# Patient Record
Sex: Male | Born: 1965 | Race: White | Hispanic: No | Marital: Married | Smoking: Never smoker
Health system: Southern US, Community
[De-identification: ages and names within clinical notes are randomized; demographics above are authoritative.]

## PROBLEM LIST (undated history)

## (undated) DIAGNOSIS — E7800 Pure hypercholesterolemia, unspecified: Secondary | ICD-10-CM

## (undated) DIAGNOSIS — I10 Essential (primary) hypertension: Secondary | ICD-10-CM

## (undated) HISTORY — DX: Essential (primary) hypertension: I10

## (undated) HISTORY — PX: OTHER SURGICAL HISTORY: SHX169

## (undated) HISTORY — DX: Pure hypercholesterolemia, unspecified: E78.00

---

## 2014-09-03 ENCOUNTER — Encounter: Payer: Self-pay | Admitting: Internal Medicine

## 2014-09-03 ENCOUNTER — Ambulatory Visit (INDEPENDENT_AMBULATORY_CARE_PROVIDER_SITE_OTHER): Payer: BLUE CROSS/BLUE SHIELD | Admitting: Internal Medicine

## 2014-09-03 ENCOUNTER — Encounter (INDEPENDENT_AMBULATORY_CARE_PROVIDER_SITE_OTHER): Payer: Self-pay

## 2014-09-03 ENCOUNTER — Encounter: Payer: Self-pay | Admitting: *Deleted

## 2014-09-03 VITALS — BP 140/82 | HR 88 | Temp 99.1°F | Ht 70.0 in | Wt 236.6 lb

## 2014-09-03 DIAGNOSIS — I7121 Aneurysm of the ascending aorta, without rupture: Secondary | ICD-10-CM | POA: Insufficient documentation

## 2014-09-03 DIAGNOSIS — R05 Cough: Secondary | ICD-10-CM

## 2014-09-03 DIAGNOSIS — R058 Other specified cough: Secondary | ICD-10-CM | POA: Insufficient documentation

## 2014-09-03 DIAGNOSIS — I712 Thoracic aortic aneurysm, without rupture: Secondary | ICD-10-CM

## 2014-09-03 DIAGNOSIS — I10 Essential (primary) hypertension: Secondary | ICD-10-CM

## 2014-09-03 MED ORDER — FAMOTIDINE 20 MG PO TABS: ORAL_TABLET | ORAL | Status: DC

## 2014-09-03 MED ORDER — PANTOPRAZOLE SODIUM 40 MG PO TBEC: 40.0000 mg | DELAYED_RELEASE_TABLET | Freq: Every day | ORAL | Status: DC

## 2014-09-03 MED ORDER — VALSARTAN 160 MG PO TABS: 160.0000 mg | ORAL_TABLET | Freq: Every day | ORAL | Status: DC

## 2014-09-03 MED ORDER — TRAMADOL HCL 50 MG PO TABS: ORAL_TABLET | ORAL | Status: DC

## 2014-09-03 NOTE — Assessment & Plan Note (Signed)
In summary he has new onset refractory cough/ wheezing refractory to really aggressive asthma rx including sama/saba/steroids so this is a very severe cough syndrome   The most common causes of chronic cough in immunocompetent adults include the following: upper airway cough syndrome (UACS), previously referred to as postnasal drip syndrome (PNDS), which is caused by variety of rhinosinus conditions; (2) asthma; (3) GERD; (4) chronic bronchitis from cigarette smoking or other inhaled environmental irritants; (5) nonasthmatic eosinophilic bronchitis; and (6) bronchiectasis.   These conditions, singly or in combination, have accounted for up to 94% of the causes of chronic cough in prospective studies.   Other conditions have constituted no >6% of the causes in prospective studies These have included bronchogenic carcinoma, chronic interstitial pneumonia, sarcoidosis, left ventricular failure, ACEI-induced cough, and aspiration from a condition associated with pharyngeal dysfunction.    Chronic cough is often simultaneously caused by more than one condition. A single cause has been found from 38 to 82% of the time, multiple causes from 18 to 62%. Multiply caused cough has been the result of three diseases up to 42% of the time.       Based on hx and exam, this is almost certainly  Upper airway cough syndrome, so named because it's frequently impossible to sort out how much is  CR/sinusitis with freq throat clearing (which can be related to primary GERD)   vs  causing  secondary (" extra esophageal")  GERD from wide swings in gastric pressure that occur with throat clearing, often  promoting self use of mint and menthol lozenges that reduce the lower esophageal sphincter tone and exacerbate the problem further in a cyclical fashion.   These are the same pts (now being labeled as having "irritable larynx syndrome" by some cough centers) who not infrequently have a history of having failed to tolerate ace  inhibitors,  dry powder inhalers or biphosphonates or report having atypical reflux symptoms that don't respond to standard doses of PPI , and are easily confused as having aecopd or asthma flares by even experienced allergists/ pulmonologists.   The first step is to maximize acid suppression and eliminate acei and cyclical coughing then regroup if the cough persists.  See instructions for specific recommendations which were reviewed directly with the patient who was given a copy with highlighter outlining the key components.

## 2014-09-03 NOTE — Progress Notes (Signed)
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 but he has no memory of it then abruptly ill with a head cold one week befor tgiving 2015 [facility] and rx ceftin/ depomedrol / codeine but never completely resolved then one week before xmas seen by company doc rx oral steroids/ inhaler/ more codeine but continued with noct cough to the point where couldn't lie flat assoc with am wheeze per wife much worse p levaquin > [place] [facility] 08/28/14 with sob > [facility] 09/03/2014    09/03/2014 1st  Pulmonary office visit/ Jamaira Sherk / on acei for hbp Chief Complaint  Patient presents with  . Pulmonary Consult    [facility],had steroid pak,abx,inhaler.Had coughing problems since Thanksgiving got worse when went to ER.Not sleeping well, sob with exertion,cough-occass. prod.-dk. yellow and clear sticky,wheezing,denies cp,midchest tightness,fever only in Nov.,PND  Cough started abruptly late nov 2015 persisted  daily since despite multiple abx rx > typcially  starts  after 3pm gradually worse as evening goes on  To point of gag and vomit - sob even when not coughing. Some better with saba/ sama  No obvious day to day or daytime variabilty or assoc chronic cough or cp or chest tightness, subjective wheeze overt sinus or hb symptoms. No unusual exp hx or h/o childhood pna/ asthma or knowledge of premature birth.   Also denies any obvious fluctuation of symptoms with weather or environmental changes or other aggravating or alleviating factors except as outlined above   Current Medications, Allergies, Complete Past Medical History, Past Surgical History, Family History, and Social History were reviewed in [facility] electronic medical record.  ROS  The following are not active complaints unless bolded sore throat,  dysphagia, dental problems, itching, sneezing,  nasal congestion or excess/ purulent secretions, ear ache,   fever, chills, sweats, unintended wt loss, pleuritic or exertional cp, hemoptysis,  orthopnea pnd or leg swelling, presyncope, palpitations, heartburn, abdominal pain, anorexia, nausea, vomiting, diarrhea  or change in bowel or urinary habits, change in stools or urine, dysuria,hematuria,  rash, arthralgias, visual complaints, headache, numbness weakness or ataxia or problems with walking or coordination,  change in mood/affect or memory.           Review of Systems     Objective:   Physical Exam   amb obese wm with classic pseudowheeze  Wt Readings from Last 3 Encounters:  09/03/14 107.321 kg (236 lb 9.6 oz)    Vital signs reviewed   HEENT: nl dentition, turbinates, and orophanx. Nl external ear canals without cough reflex   NECK :  without JVD/Nodes/TM/ nl carotid upstrokes bilaterally   LUNGS: no acc muscle use, clear to A and P bilaterally without cough on insp or exp maneuvers - transmitted upper airway wheeze only    CV:  RRR  no s3 or murmur or increase in P2, no edema   ABD:  soft and nontender with nl excursion in the supine position. 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

## 2014-09-03 NOTE — Assessment & Plan Note (Signed)
Defer f/u to Dr Sherral Hammersobbins ? Needs beta blocker > Strongly prefer in this setting: Bystolic, the most beta -1  selective Beta blocker available in sample form, with bisoprolol the most selective generic choice  on the market.

## 2014-09-03 NOTE — Patient Instructions (Addendum)
Stop lisinopril and start diovan 160 mg one daily  Pantoprazole (protonix) 40 mg   Take 30-60 min before first meal of the day and Pepcid 20 mg one bedtime until return to office - this is the best way to tell whether stomach acid is contributing to your problem.  GERD (REFLUX)  is an extremely common cause of respiratory symptoms just like yours , many times with no obvious heartburn at all.    It can be treated with medication, but also with lifestyle changes including avoidance of late meals, excessive alcohol, smoking cessation, and avoid fatty foods, chocolate, peppermint, colas, red wine, and acidic juices such as orange juice.  NO MINT OR MENTHOL PRODUCTS SO NO COUGH DROPS  USE SUGARLESS CANDY INSTEAD (Jolley ranchers or Stover's or Life Savers) or even ice chips will also do - the key is to swallow to prevent all throat clearing. NO OIL BASED VITAMINS - use powdered substitutes.   Finish prednisone up and only inhalers when you need but you should see the need go way down over the next week or so    Take delsym two tsp every 12 hours and supplement if needed with  tramadol 50 mg up to 2 every 4 hours to suppress the urge to cough. Swallowing water or using ice chips/non mint and menthol containing candies (such as lifesavers or sugarless jolly ranchers) are also effective.  You should rest your voice and avoid activities that you know make you cough.  Once you have eliminated the cough for 3 straight days try reducing the tramadol first,  then the delsym as tolerated.     If you are satisfied with your treatment plan,  let your doctor know and he/she can either refill your medications or you can return here when your prescription runs out.     If in any way you are not 100% satisfied,  please tell us.  If 100% better, tell your friends!  Pulmonary follow up is as needed

## 2014-09-03 NOTE — Assessment & Plan Note (Signed)
ACE inhibitors are problematic in  pts with airway complaints because  even experienced pulmonologists can't always distinguish ace effects from copd/asthma/pnds/ allergies etc.  By themselves they don't actually cause a problem, much like oxygen can't by itself start a fire, but they certainly serve as a powerful catalyst or enhancer for any "fire"  or inflammatory process in the upper airway, be it caused by an ET  tube or more commonly reflux (especially in the obese or pts with known GERD or who are on biphoshonates) or URI's, due to interference with bradykinin clearance.  The effects of acei on bradykinin levels occurs in 100% of pt's on acei (unless they surreptitiously stop the med!) but the classic cough is only reported in 5%.  This leaves 95% of pts on acei's  with a variety of syndromes including no identifiable symptom in most  vs non-specific symptoms that wax and wane depending on what other insult is occuring at the level of the upper airway.   Try diovan 160 x 4 weeks and f/u with primary care if better, if not return here for further w/u

## 2014-09-04 ENCOUNTER — Telehealth: Payer: Self-pay | Admitting: Internal Medicine

## 2014-09-04 NOTE — Telephone Encounter (Signed)
Called [phone] PT ID #  [id number]  Waiting for fax to be sent to triage fax #.

## 2014-09-04 NOTE — Telephone Encounter (Signed)
Received the fax from insurance.  These have been filled out and placed in MW look at to be signed.  Pt is aware and that we will contact him once we have received the approval or denial.  Will forward to leslie to follow up on.

## 2014-09-07 NOTE — Telephone Encounter (Signed)
Pt aware, nothing further needed.  ?

## 2014-09-07 NOTE — Telephone Encounter (Signed)
PA form signed and faxed back  Will await approval/denial

## 2014-09-07 NOTE — Telephone Encounter (Signed)
Dr Sherene SiresWert, do you want him to take 20 mg prilosec otc while we wait on PA? Please advise thanks!

## 2014-09-07 NOTE — Telephone Encounter (Signed)
Yes that's fine 

## 2014-09-07 NOTE — Telephone Encounter (Signed)
Pt needs to know if he should be taking Prilosec while we are waiting to hear from insurance

## 2014-09-09 ENCOUNTER — Institutional Professional Consult (permissible substitution): Payer: Self-pay | Admitting: Internal Medicine

## 2014-09-10 NOTE — Telephone Encounter (Signed)
Received letter of approval from ins  Pantoprazole approved from 09/08/14 until 09/08/17  Pt aware and pharmacy notified

## 2014-09-16 ENCOUNTER — Ambulatory Visit (INDEPENDENT_AMBULATORY_CARE_PROVIDER_SITE_OTHER): Payer: BLUE CROSS/BLUE SHIELD | Admitting: Internal Medicine

## 2014-09-16 ENCOUNTER — Encounter: Payer: Self-pay | Admitting: Internal Medicine

## 2014-09-16 ENCOUNTER — Ambulatory Visit (INDEPENDENT_AMBULATORY_CARE_PROVIDER_SITE_OTHER)
Admission: RE | Admit: 2014-09-16 | Discharge: 2014-09-16 | Disposition: A | Discharge status: Home / Self Care | Payer: BLUE CROSS/BLUE SHIELD | Source: Ambulatory Visit | Attending: Internal Medicine | Admitting: Internal Medicine

## 2014-09-16 ENCOUNTER — Institutional Professional Consult (permissible substitution): Payer: BLUE CROSS/BLUE SHIELD | Admitting: Internal Medicine

## 2014-09-16 VITALS — BP 128/80 | HR 94 | Temp 99.0°F | Ht 70.0 in | Wt 228.0 lb

## 2014-09-16 DIAGNOSIS — R05 Cough: Secondary | ICD-10-CM

## 2014-09-16 DIAGNOSIS — I712 Thoracic aortic aneurysm, without rupture: Secondary | ICD-10-CM

## 2014-09-16 DIAGNOSIS — R058 Other specified cough: Secondary | ICD-10-CM

## 2014-09-16 DIAGNOSIS — I7121 Aneurysm of the ascending aorta, without rupture: Secondary | ICD-10-CM

## 2014-09-16 DIAGNOSIS — I10 Essential (primary) hypertension: Secondary | ICD-10-CM

## 2014-09-16 DIAGNOSIS — J45901 Unspecified asthma with (acute) exacerbation: Secondary | ICD-10-CM

## 2014-09-16 MED ORDER — PREDNISONE 10 MG PO TABS: ORAL_TABLET | ORAL | Status: DC

## 2014-09-16 MED ORDER — AMOXICILLIN-POT CLAVULANATE 875-125 MG PO TABS: 1.0000 | ORAL_TABLET | Freq: Two times a day (BID) | ORAL | Status: DC

## 2014-09-16 MED ORDER — TRAMADOL HCL 50 MG PO TABS: ORAL_TABLET | ORAL | Status: AC

## 2014-09-16 MED ORDER — MOMETASONE FURO-FORMOTEROL FUM 100-5 MCG/ACT IN AERO: INHALATION_SPRAY | RESPIRATORY_TRACT | Status: DC

## 2014-09-16 NOTE — Progress Notes (Signed)
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     History of Present Illness  09/03/2014 1st [place] [facility] office visit/ Verla Bryngelson / on acei for hbp Chief Complaint  Patient presents with  . Pulmonary Consult    [facility],2016,had steroid pak,abx,inhaler.Had coughing problems since Thanksgiving got worse when went to ER.Not sleeping well, sob with exertion,cough-occass. prod.-dk. yellow and clear sticky,wheezing,denies cp,midchest tightness,fever only in Nov.,PND  Cough started abruptly late nov 2015 persisted  daily since then despite multiple abx rx > typcially  starts  after 3pm gradually worse as evening goes on  To point of gag and vomit - sob even when not coughing. Some better with saba/ sama rec Stop lisinopril and start diovan 160 mg one daily Pantoprazole (protonix) 40 mg   Take 30-60 min before first meal of the day and Pepcid 20 mg one bedtime until return to office  GERD diet  Finish prednisone up and only use inhalers when you need but you should see the need go way down over the next week or so   Take delsym two tsp every 12 hours and supplement if needed with  tramadol 50 mg up to 2 every 4 hours   Once you have eliminated the cough for 3 straight days try reducing the tramadol first,  then the delsym as tolerated.     09/16/2014  Acute ov/Jyla Hopf re:  persistent cough off acei since last ov  Chief Complaint  Patient presents with  . Acute Visit    Pt c/o increased SOB and cough for the past 2 days. He woke up feeling SOB and has been using atrovent and albuterol inhalers every 3 hours. He has a prod cough with brown to yellow "stringy" mucus.   able to go s any inhalers until 24 h prior to OV   And stopped tramadol x 3 days prior to onset of symptoms  Last inhalers x 3 h prior to OV    No obvious day to day or daytime variabilty or assoc  cp or chest tightness, subjective wheeze overt sinus or hb symptoms. No unusual exp hx or h/o childhood pna/ asthma or knowledge of premature birth.   Also denies any obvious fluctuation of symptoms with weather or environmental changes or other aggravating or alleviating factors except as outlined above   Current Medications, Allergies, Complete Past Medical History, Past Surgical History, Family History, and Social History were reviewed in [facility] electronic medical record.  ROS  The following are not active complaints unless bolded sore throat, dysphagia, dental problems, itching, sneezing,  nasal congestion or excess/ purulent secretions, ear ache,   fever, chills, sweats, unintended wt loss, pleuritic or exertional cp, hemoptysis,  orthopnea pnd or leg swelling, presyncope, palpitations, heartburn, abdominal pain, anorexia, nausea, vomiting, diarrhea  or change in bowel or urinary habits, change in stools or urine, dysuria,hematuria,  rash, arthralgias, visual complaints, headache, numbness weakness or ataxia or problems with walking or coordination,  change in mood/affect or memory.               Objective:   Physical Exam   amb obese wm with extremely hoarse voice/ prominent pseudowheeze  Wt Readings from Last 3 Encounters:  09/16/14 228 lb (103.42 kg)  09/03/14 236 lb 9.6 oz (107.321 kg)    Vital signs reviewed  HEENT: nl dentition, turbinates, and orophanx. Nl external ear  canals without cough reflex   NECK :  without JVD/Nodes/TM/ nl carotid upstrokes bilaterally   LUNGS: no acc muscle use, insp and exp wheezes, difficult to distinguish from upper airway    CV:  RRR  no s3 or murmur or increase in P2, no edema   ABD:  soft and nontender with nl excursion in the supine position. 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

## 2014-09-16 NOTE — Patient Instructions (Addendum)
You will need follow up on your thoracic aorta but I will defer that Dr Sherral Hammersobbins and tight control of your blood pressure is the key   Start dulera 100 Take 2 puffs first thing in am and then another 2 puffs about 12 hours later.   Only use your albuterol as a rescue medication to be used if you can't catch your breath by resting or doing a relaxed purse lip breathing pattern.  - The less you use it, the better it will work when you need it. - Ok to use up to 2 puffs  every 4 hours if you must but call for immediate appointment if use goes up over your usual need - Don't leave home without it !!  (think of it like the spare tire for your car)   The key to effective treatment for your cough is eliminating the non-stop cycle of cough you're stuck in long enough to let your airway heal completely and then see if there is anything still making you cough once you stop the cough suppression, but this should take no more than 5 days to figure out  First take delsym two tsp every 12 hours and supplement if needed with  tramadol 50 mg up to 2 every 4 hours to suppress the urge to cough at all or even clear your throat. Swallowing water or using ice chips/non mint and menthol containing candies (such as lifesavers or sugarless jolly ranchers) are also effective.  You should rest your voice and avoid activities that you know make you cough.  Once you have eliminated the cough for 3 straight days try reducing the tramadol first,  then the delsym as tolerated.    Prednisone 10 mg take  4 each am x 2 days,   2 each am x 2 days,  1 each am x 2 days and stop (this is to eliminate allergies and inflammation from coughing)  Continue Protonix (pantoprazole) Take 30-60 min before first meal of the day and Pepcid 20 mg one bedtime plus chlorpheniramine 4 mg x 2 at bedtime (both available over the counter)  until cough is completely gone for at least a week without the need for cough suppression    Augmentin 875 mg  take one pill twice daily  X 10 days - take at breakfast and supper with large glass of water.  It would help reduce the usual side effects (diarrhea and yeast infections) if you ate cultured yogurt at lunch.   If not better next step is sinus CT if not better - call [facility] at [phone] to schedule prior to your follow up here   Please remember to go to the xray  department downstairs for your tests - we will call you with the results when they are available.  Please schedule a follow up office visit in 2 weeks, sooner if needed        .

## 2014-09-17 ENCOUNTER — Encounter: Payer: Self-pay | Admitting: Internal Medicine

## 2014-09-17 DIAGNOSIS — J45901 Unspecified asthma with (acute) exacerbation: Secondary | ICD-10-CM | POA: Insufficient documentation

## 2014-09-17 NOTE — Assessment & Plan Note (Signed)
Spirometry 09/16/14  fevq 1.27 (31%) ratio 50 with obstructed f/v  - 09/17/2014 p extensive coaching HFA effectiveness =    90% so try dulera 100 2bid   Not clear why he would suddenly develop true asthma in setting of upper airway cough syndrome if he never had tendency to asthma previously but clearly needs a controlling medication > dulera 100 should be well tolerated s causing cough

## 2014-09-17 NOTE — Assessment & Plan Note (Signed)
Too soon to exclude acei effects from the "usual list of suspects" but clearly not free of the cough yet with airway symptoms that are difficult to control and sort out.  DDX of  difficult airways management all start with A and  include Adherence, Ace Inhibitors, Acid Reflux, Active Sinus Disease, Alpha 1 Antitripsin deficiency, Anxiety masquerading as Airways dz,  ABPA,  allergy(esp in young), Aspiration (esp in elderly), Adverse effects of DPI,  Active smokers, plus two Bs  = Bronchiectasis and Beta blocker use..and one C= CHF   Adherence is always the initial "prime suspect" and is a multilayered concern that requires a "trust but verify" approach in every patient - starting with knowing how to use medications, especially inhalers, correctly, keeping up with refills and understanding the fundamental difference between maintenance and prns vs those medications only taken for a very short course and then stopped and not refilled.  The proper method of use, as well as anticipated side effects, of a metered-dose inhaler are discussed and demonstrated to the patient. Improved effectiveness after extensive coaching during this visit to a level of approximately  90% so try dulera 100 2bid in case this really is asthma related cough  ? Acid (or non-acid) GERD > always difficult to exclude as up to 75% of pts in some series report no assoc GI/ Heartburn symptoms> rec max (24h)  acid suppression and diet restrictions/ reviewed and instructions given in writing.   ? Active sinus dz > augmentin x 10 days then sinus ct if not better  ? acei > leave off indefinitely given the ambiguity inherent in evaluation of upper airway coughing   See instructions for specific recommendations which were reviewed directly with the patient who was given a copy with highlighter outlining the key components.

## 2014-09-17 NOTE — Assessment & Plan Note (Signed)
Trial off acei 09/03/2014 due to cough   Adequate control on present rx, reviewed > no change in rx needed  > further rx per primary care

## 2014-09-17 NOTE — Assessment & Plan Note (Signed)
See CT [place] 08/28/14 x 4.3 cm   Probably needs to be on BB > Strongly prefer in this setting: Bystolic, the most beta -1  selective Beta blocker available in sample form, with bisoprolol the most selective generic choice  on the market >defer to primary care

## 2014-09-18 ENCOUNTER — Telehealth: Payer: Self-pay | Admitting: Internal Medicine

## 2014-09-18 NOTE — Telephone Encounter (Signed)
Best to swallow mucus rather than clear his throat and aggravate the cough.   Best option is mucinex dm 1200 mg every 12 hours and only take the tramadol if can't control the cough with mucinex dm  cxr was perfectly clear which is not a surprise because his problem is in the airways not out in the periphery of the lung

## 2014-09-18 NOTE — Telephone Encounter (Signed)
Patient wants results of his CXR.  Patient says that Dr. Sherene SiresWert told him not to cough up anything, to suppress his cough.  Pt says that he has a lot of mucus in his throat and having trouble suppressing the cough.  MW please advise.

## 2014-09-18 NOTE — Telephone Encounter (Signed)
Patient notified and advised to call us back if he does not improve or worsens.  Nothing further needed.

## 2014-09-22 ENCOUNTER — Telehealth: Payer: Self-pay | Admitting: Internal Medicine

## 2014-09-22 DIAGNOSIS — R05 Cough: Secondary | ICD-10-CM

## 2014-09-22 DIAGNOSIS — R058 Other specified cough: Secondary | ICD-10-CM

## 2014-09-22 NOTE — Telephone Encounter (Signed)
Spoke with the pt  He states that his cough has improved some, but still coughing some and bothered by PND  Pt wants to set up CT sinus prior to next ov 09/30/14  [facility]  Nothing further needed

## 2014-09-30 ENCOUNTER — Ambulatory Visit (INDEPENDENT_AMBULATORY_CARE_PROVIDER_SITE_OTHER)
Admission: RE | Admit: 2014-09-30 | Discharge: 2014-09-30 | Disposition: A | Discharge status: Home / Self Care | Payer: BLUE CROSS/BLUE SHIELD | Source: Ambulatory Visit | Attending: Internal Medicine | Admitting: Internal Medicine

## 2014-09-30 ENCOUNTER — Encounter: Payer: Self-pay | Admitting: Internal Medicine

## 2014-09-30 ENCOUNTER — Ambulatory Visit (INDEPENDENT_AMBULATORY_CARE_PROVIDER_SITE_OTHER): Payer: BLUE CROSS/BLUE SHIELD | Admitting: Internal Medicine

## 2014-09-30 VITALS — BP 130/88 | HR 81 | Ht 70.0 in | Wt 234.0 lb

## 2014-09-30 DIAGNOSIS — R05 Cough: Secondary | ICD-10-CM

## 2014-09-30 DIAGNOSIS — I7121 Aneurysm of the ascending aorta, without rupture: Secondary | ICD-10-CM

## 2014-09-30 DIAGNOSIS — R058 Other specified cough: Secondary | ICD-10-CM

## 2014-09-30 DIAGNOSIS — I712 Thoracic aortic aneurysm, without rupture: Secondary | ICD-10-CM

## 2014-09-30 DIAGNOSIS — J45901 Unspecified asthma with (acute) exacerbation: Secondary | ICD-10-CM

## 2014-09-30 MED ORDER — MOMETASONE FURO-FORMOTEROL FUM 100-5 MCG/ACT IN AERO: INHALATION_SPRAY | RESPIRATORY_TRACT | Status: DC

## 2014-09-30 NOTE — Progress Notes (Signed)
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     History of Present Illness  09/03/2014 1st  Pulmonary office visit/ Wert / on acei for hbp Chief Complaint  Patient presents with  . Pulmonary Consult    bronchitis per ER MD @ [facility] Jan. 8,2016,had steroid pak,abx,inhaler.Had coughing problems since Thanksgiving got worse when went to ER.Not sleeping well, sob with exertion,cough-occass. prod.-dk. yellow and clear sticky,wheezing,denies cp,midchest tightness,fever only in Nov.,PND  Cough started abruptly late nov 2015 persisted  daily since then despite multiple abx rx > typcially  starts  after 3pm gradually worse as evening goes on  To point of gag and vomit - sob even when not coughing. Some better with saba/ sama rec Stop lisinopril and start diovan 160 mg one daily Pantoprazole (protonix) 40 mg   Take 30-60 min before first meal of the day and Pepcid 20 mg one bedtime until return to office  GERD diet  Finish prednisone up and only use inhalers when you need but you should see the need go way down over the next week or so   Take delsym two tsp every 12 hours and supplement if needed with  tramadol 50 mg up to 2 every 4 hours   Once you have eliminated the cough for 3 straight days try reducing the tramadol first,  then the delsym as tolerated.     09/16/2014  Acute ov/Wert  re: persistent cough off acei since last ov  Chief Complaint  Patient presents with  . Acute Visit    Pt c/o increased SOB and cough for the past 2 days. He woke up feeling SOB and has been using atrovent and albuterol inhalers every 3 hours. He has a prod cough with brown to yellow "stringy" mucus.   able to go s any inhalers until 24 h prior to OV   And stopped tramadol x 3 days prior to onset of symptoms  Last inhalers x 3 h prior to OV   rec You will need follow up on your thoracic aorta but I will defer that Dr Sherral Hammersobbins and tight control of your blood pressure is the key  Start dulera 100 Take 2 puffs first thing in am and then another 2 puffs about 12 hours later.  Only use your albuterol as a rescue medication    First take delsym two tsp every 12 hours and supplement if needed with  tramadol 50 mg up to 2 every 4 hours  Once you have eliminated the cough for 3 straight days try reducing the tramadol first,  then the delsym as tolerated.   Prednisone 10 mg take  4 each am x 2 days,   2 each am x 2 days,  1 each am x 2 days and stop (this is to eliminate allergies and inflammation  from coughing) Continue Protonix (pantoprazole) Take 30-60 min before first meal of the day and Pepcid 20 mg one bedtime plus chlorpheniramine 4 mg x 2 at bedtime (both available over the counter)  until cough is completely gone for at least a week without the need for cough suppression  Augmentin 875 mg take one pill twice daily  X 10 days - take at breakfast and supper with large glass of water.  It would help reduce the usual side effects (diarrhea and yeast infections) if you ate cultured yogurt at lunch.  If not better next step is sinus CT if not better    09/30/2014 f/u ov/Wert re: asthma-like symptoms ? All acei related, resolved off acei since 09/16/14  Chief Complaint  Patient presents with  . Follow-up    Pt states that his breathing has improved back to baseline. His cough is some better and is now non  prod. He wakes up in the am with sore throat and PND.     Not limited by breathing from desired activities  / no noct symptoms, no need for saba  Sore throat started p rx with dulera 100   No obvious day to day or daytime variabilty or assoc  cp or chest tightness, subjective wheeze overt sinus or hb symptoms. No unusual exp hx or h/o childhood pna/ asthma or knowledge of premature birth.   Also denies any obvious fluctuation of symptoms with weather or environmental changes or other aggravating or alleviating factors except as outlined above   Current Medications, Allergies, Complete Past Medical History, Past Surgical History, Family History, and Social History were reviewed in [employer] record.  ROS  The following are not active complaints unless bolded sore throat, dysphagia, dental problems, itching, sneezing,  nasal congestion or excess/ purulent secretions, ear ache,   fever, chills, sweats, unintended wt loss, pleuritic or exertional cp, hemoptysis,  orthopnea pnd or leg swelling, presyncope, palpitations, heartburn, abdominal pain, anorexia, nausea, vomiting, diarrhea  or change in bowel or urinary habits, change in stools or urine, dysuria,hematuria,  rash, arthralgias, visual complaints, headache, numbness weakness or ataxia or problems with walking or coordination,  change in mood/affect or memory.               Objective:   Physical Exam   amb obese wm nad good voice texture now   Wt Readings from Last 3 Encounters:  09/30/14 234 lb (106.142 kg)  09/16/14 228 lb (103.42 kg)  09/03/14 236 lb 9.6 oz (107.321 kg)    Vital signs reviewed - bp upper limits of target noted     HEENT: nl dentition, turbinates, a  Nl external ear canals without cough reflex- mild erythema oropharynx  s thrush    NECK :  without JVD/Nodes/TM/ nl carotid upstrokes bilaterally   LUNGS: no acc muscle use, completely clear bilaterally    CV:  RRR  no s3 or murmur or  increase in P2, no edema   ABD:  soft and nontender with nl excursion in the supine position. 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 Frothy secretions layer dependently within the left maxillary sinus without evidence of obstruction of the sinus meatus. This may represent residual or resolving sinusitis. 2. 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 3. Otherwise, the visualized sinuses are unremarkable.    Assessment:        Outpatient Encounter Prescriptions as of 09/30/2014  Medication Sig  . albuterol (PROVENTIL HFA;VENTOLIN HFA) 108 (90 BASE) MCG/ACT inhaler Inhale 2 puffs into the lungs every 4 (four) hours as needed for wheezing or shortness of breath.  . Ascorbic Acid (VITAMIN C) 100 MG tablet Take 100 mg by mouth daily.  Marland Kitchen atorvastatin (LIPITOR) 10 MG tablet Take 10 mg by mouth daily.  . famotidine (PEPCID) 20 MG tablet One at bedtime  . mometasone-formoterol (DULERA) 100-5 MCG/ACT AERO Take 2 puffs first thing in am and then another 2 puffs about 12 hours later.  . Multiple Vitamin (MULTIVITAMIN) capsule Take 1 capsule by mouth daily.  . pantoprazole (PROTONIX) 40 MG tablet Take 1 tablet (40 mg total) by mouth daily. Take 30-60 min before first meal of the day  . traMADol (ULTRAM) 50 MG tablet 1-2 every 4 hours as needed for cough or pain  . valsartan (DIOVAN) 160 MG tablet Take 1 tablet (160 mg total) by mouth daily.  . [DISCONTINUED] mometasone-formoterol (DULERA) 100-5 MCG/ACT AERO Take 2 puffs first thing in am and then another 2 puffs about 12 hours  later.  . [DISCONTINUED] amoxicillin-clavulanate (AUGMENTIN) 875-125 MG per tablet Take 1 tablet by mouth 2 (two) times daily.  . [DISCONTINUED] predniSONE (DELTASONE) 10 MG tablet Take  4 each am x 2 days,   2 each am x 2 days,  1 each am x 2 days and stop

## 2014-09-30 NOTE — Patient Instructions (Signed)
Dulera 100 Take up to 2 puffs first thing in am and then another 2 puffs about 12 hours later > if doing great don't take at all   Continue Pantoprazole (protonix) 40 mg   Take 30-60 min before first meal of the day and Pepcid 20 mg one bedtime until  Completely back to your normal self for at leat a week but start back immediately for any respiratory flare.  We will contact you re further sinus treatment if needed after we get back the radiology report on your L maxillary sinus which is still abnormal   See Sherral Hammersobbins as planned  and we can see you here as needed

## 2014-10-01 ENCOUNTER — Encounter: Payer: Self-pay | Admitting: Internal Medicine

## 2014-10-01 ENCOUNTER — Other Ambulatory Visit: Payer: Self-pay | Admitting: Internal Medicine

## 2014-10-01 MED ORDER — AMOXICILLIN-POT CLAVULANATE 875-125 MG PO TABS: 1.0000 | ORAL_TABLET | Freq: Two times a day (BID) | ORAL | Status: AC

## 2014-10-01 NOTE — Progress Notes (Signed)
Quick Note:  Spoke with pt and notified of results per Dr. Wert. Pt verbalized understanding and denied any questions.  ______ 

## 2014-10-01 NOTE — Assessment & Plan Note (Signed)
Trial off acei  09/03/2014 > marked improvement 09/30/14 - sinus CT 09/30/14  1. Frothy secretions layer dependently within the left maxillary sinus without evidence of obstruction of the sinus meatus. This may represent residual or resolving sinusitis. 2. 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 3. Otherwise, the visualized sinuses are unremarkable.   The head cold that sparked this illness may have resulted in acute/ chronic sinusitis that hasn't completely resolved but off acei he is feeling much bette and note he did not have asthma hx previously so re try off dulera (which may be causing his sore throat)  If develops any worse sore throat/ nasal symptoms should probably take augmentin another 20 days and ent f/u prn  Pulmonary f/u can also be prn at this point  See instructions for specific recommendations which were reviewed directly with the patient who was given a copy with highlighter outlining the key components.

## 2014-10-01 NOTE — Assessment & Plan Note (Signed)
Note bp not ideal > Strongly prefer in this setting: Bystolic, the most beta -1  selective Beta blocker available in sample form, with bisoprolol the most selective generic choice  on the market, but I will defer final choice of rx to Dr Sherral Hammersobbins' capable hands

## 2014-10-01 NOTE — Assessment & Plan Note (Signed)
Spirometry 09/16/14  fevq 1.27 (31%) ratio 50 with obstructed f/v  - 09/17/2014 p extensive coaching HFA effectiveness =    90% so try dulera 100 2bid  > changed to prn 09/30/14   Sore throat now may be due to ics / rec try taper and see if flares and if so resume it and treat sinuses more aggressively plus perhaps trial of singulair

## 2014-10-08 ENCOUNTER — Other Ambulatory Visit: Payer: Self-pay | Admitting: Internal Medicine

## 2014-10-08 MED ORDER — VALSARTAN 160 MG PO TABS: 160.0000 mg | ORAL_TABLET | Freq: Every day | ORAL | Status: DC

## 2014-10-08 MED ORDER — FAMOTIDINE 20 MG PO TABS: ORAL_TABLET | ORAL | Status: DC

## 2014-10-08 MED ORDER — PANTOPRAZOLE SODIUM 40 MG PO TBEC: 40.0000 mg | DELAYED_RELEASE_TABLET | Freq: Every day | ORAL | Status: AC

## 2014-10-23 ENCOUNTER — Other Ambulatory Visit: Payer: Self-pay | Admitting: Internal Medicine

## 2014-10-23 MED ORDER — FAMOTIDINE 20 MG PO TABS: ORAL_TABLET | ORAL | Status: AC

## 2014-10-23 MED ORDER — VALSARTAN 160 MG PO TABS: 160.0000 mg | ORAL_TABLET | Freq: Every day | ORAL | Status: AC

## 2014-12-11 ENCOUNTER — Other Ambulatory Visit: Payer: Self-pay | Admitting: Internal Medicine

## 2014-12-11 MED ORDER — MOMETASONE FURO-FORMOTEROL FUM 100-5 MCG/ACT IN AERO: INHALATION_SPRAY | RESPIRATORY_TRACT | Status: AC

## 2016-05-26 DIAGNOSIS — J329 Chronic sinusitis, unspecified: Secondary | ICD-10-CM | POA: Diagnosis not present

## 2016-05-26 DIAGNOSIS — J40 Bronchitis, not specified as acute or chronic: Secondary | ICD-10-CM | POA: Diagnosis not present

## 2016-11-14 DIAGNOSIS — Z1389 Encounter for screening for other disorder: Secondary | ICD-10-CM | POA: Diagnosis not present

## 2016-11-14 DIAGNOSIS — Z6837 Body mass index (BMI) 37.0-37.9, adult: Secondary | ICD-10-CM | POA: Diagnosis not present

## 2016-11-14 DIAGNOSIS — M5416 Radiculopathy, lumbar region: Secondary | ICD-10-CM | POA: Diagnosis not present

## 2017-01-17 IMAGING — CT CT PARANASAL SINUSES LIMITED
1 of 2 series · 15 of 19 positions shown, 19 images · non-contrast
Comparison: None.

CLINICAL DATA: 48-year-old male with chronic cough, hoarseness and
drainage for the past 2 months.

EXAM:
CT PARANASAL SINUS LIMITED WITHOUT CONTRAST
TECHNIQUE: Non-contiguous multidetector CT images of the paranasal sinuses were
obtained in a single plane without contrast.

[Series 4: ltd sinus 3.0 h30s · axial · 0.35mm/px · z∈[-101,-6]mm · 15 of 18 slices shown, 19 images]
[im 2/18  brain]
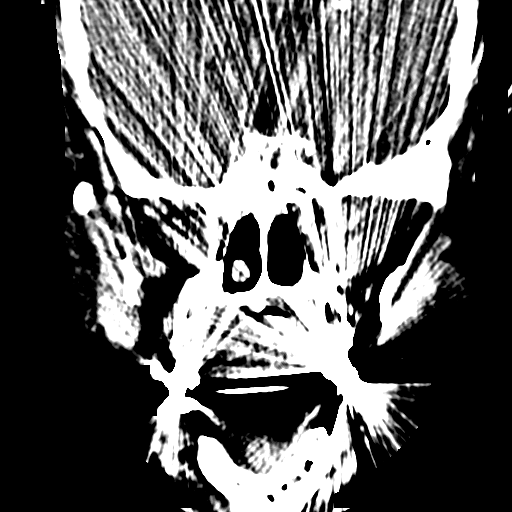
[im 2/18  bone]
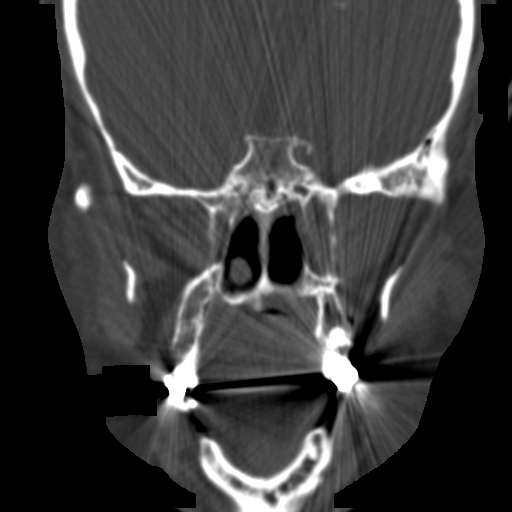
[im 3/18  bone]
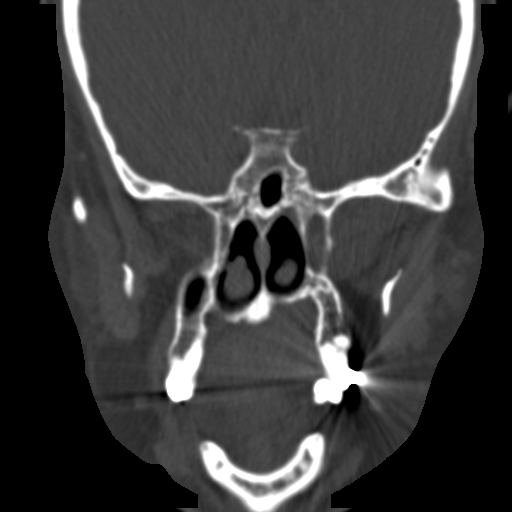
[im 4/18  bone]
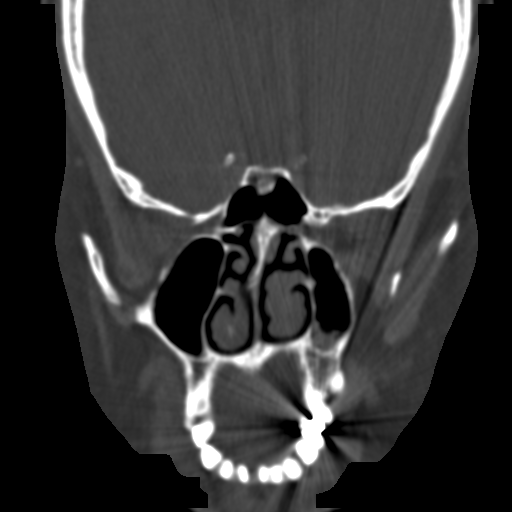
[im 5/18  bone]
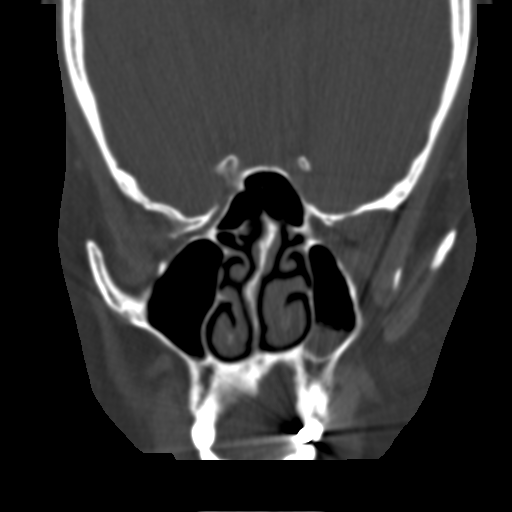
[im 6/18  brain]
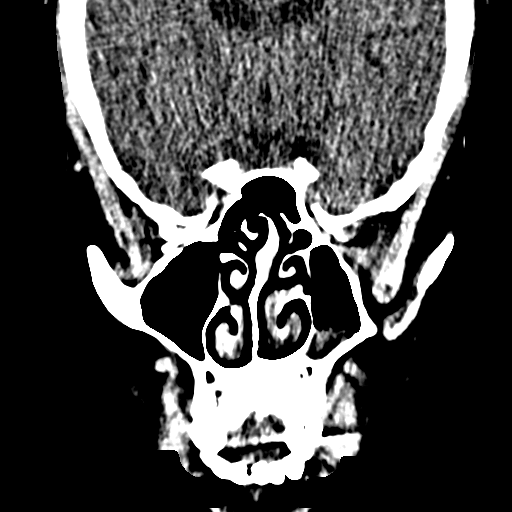
[im 6/18  bone]
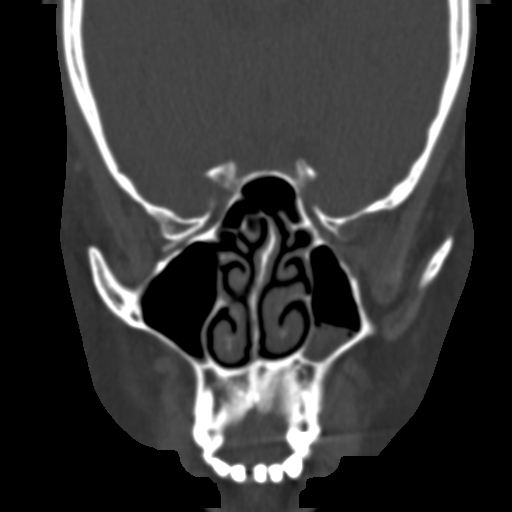
[im 7/18  bone]
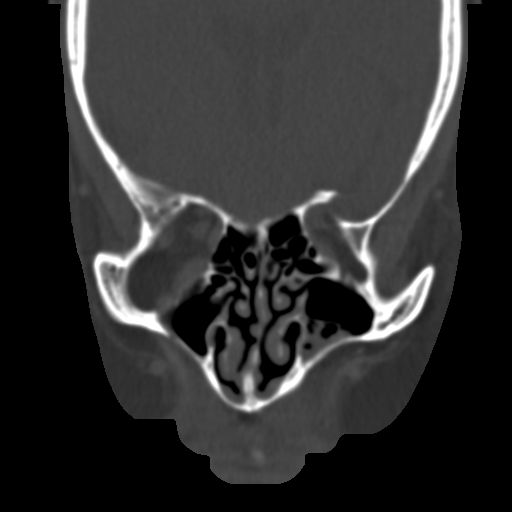
[im 8/18  bone]
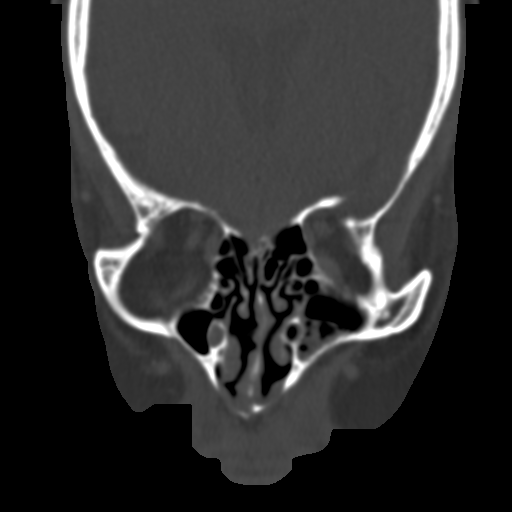
[im 10/18  bone]
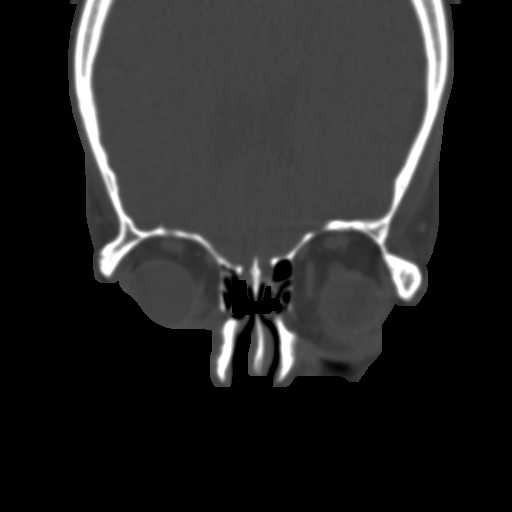
[im 11/18  brain]
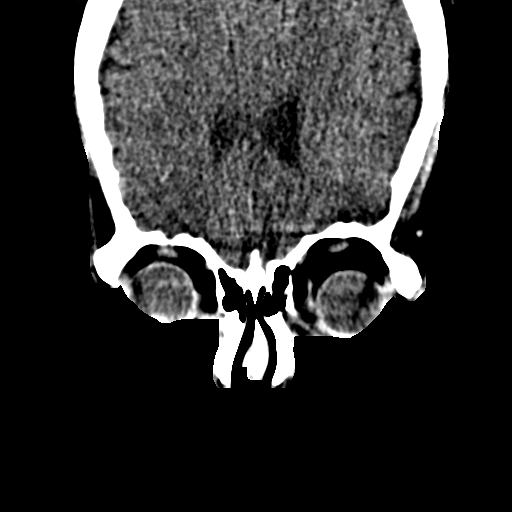
[im 11/18  bone]
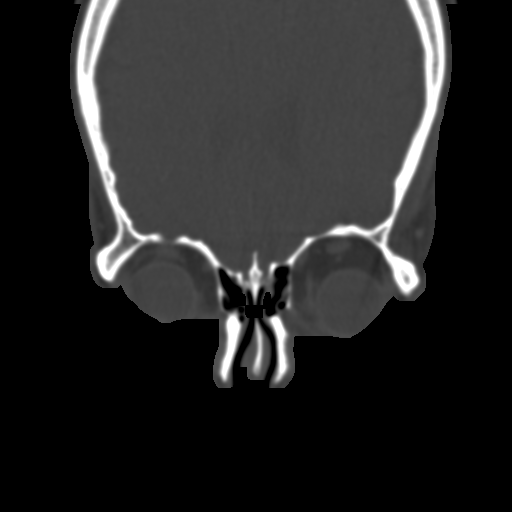
[im 12/18  bone]
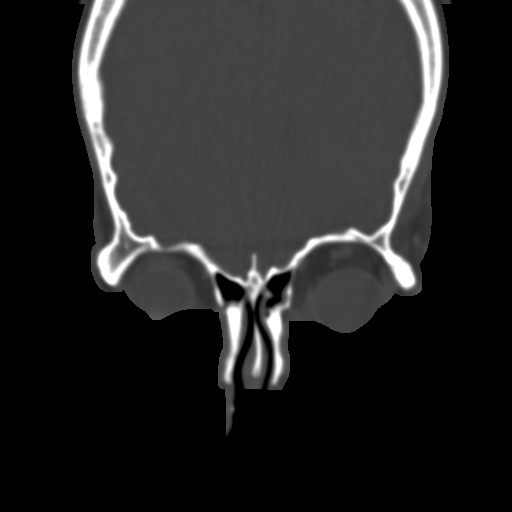
[im 13/18  bone]
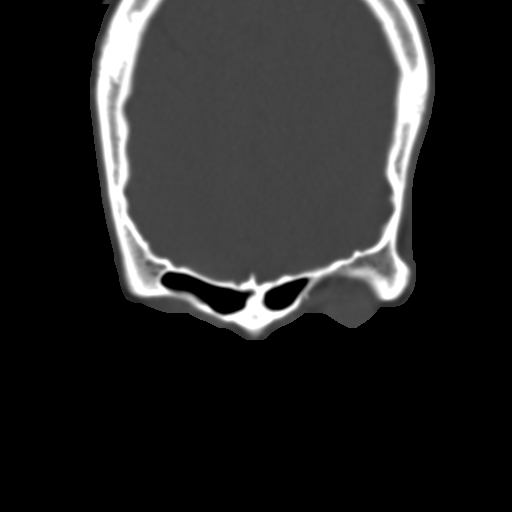
[im 14/18  bone]
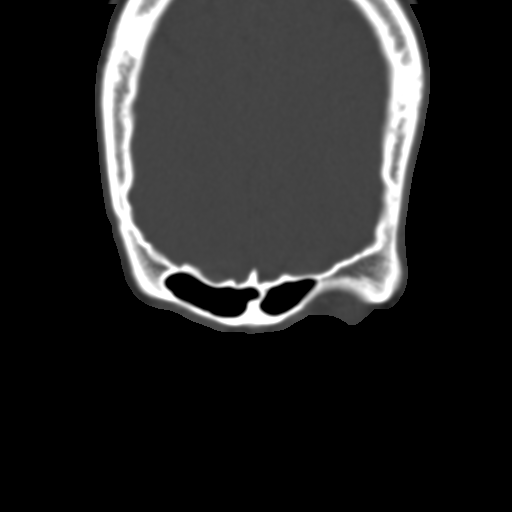
[im 15/18  brain]
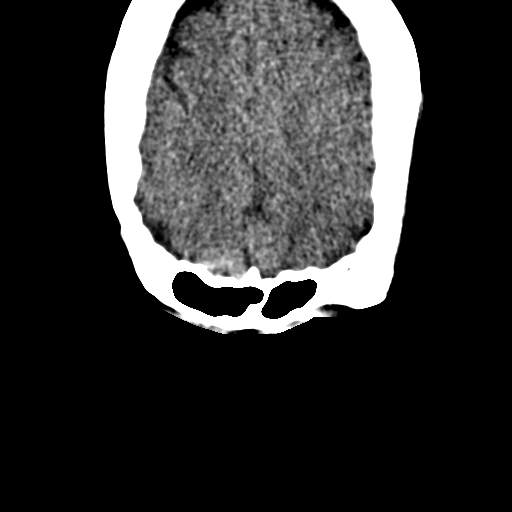
[im 15/18  bone]
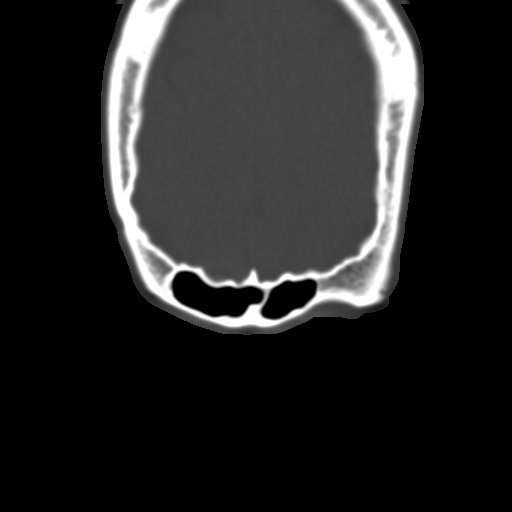
[im 16/18  bone]
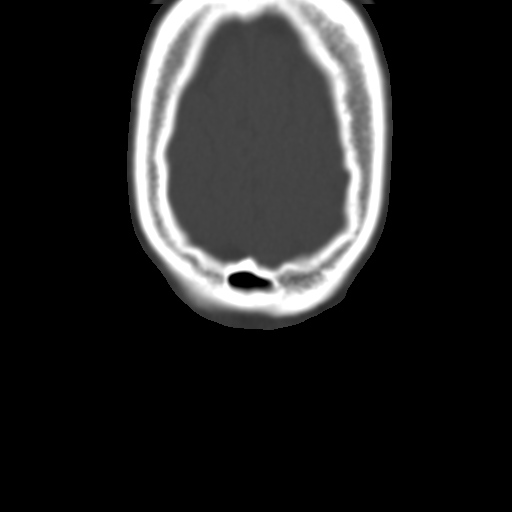
[im 17/18  bone]
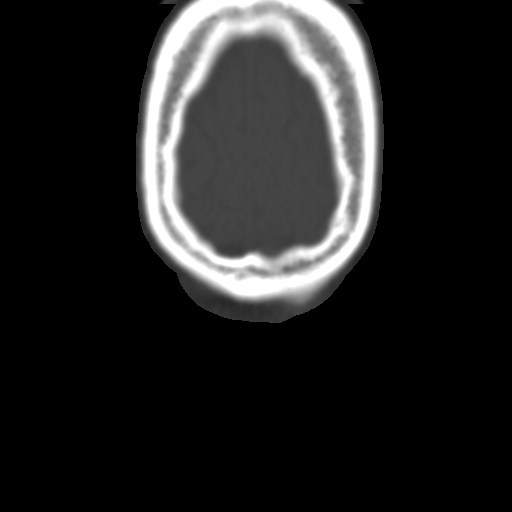

[15 of 19 positions shown; findings below may reference images not displayed]

FINDINGS: Frothy secretions within air-fluid level in the left maxillary
antrum. Is the sinus ostium is widely patent. No evidence of
obstruction. The frontal sinuses and ethmoid air cells are widely
patent. Small osteoma in the roof of the left sphenoid air cell.
Limited visualization of the intracranial contents demonstrates no
acute abnormality. No focal bone or soft tissue abnormality.
IMPRESSION: 1. Frothy secretions layer dependently within the left maxillary
sinus without evidence of obstruction of the sinus meatus. This may
represent residual or resolving sinusitis.
2. Small osteoma in the roof of the left sphenoid air cell without
evidence of complication.
3. Otherwise, the visualized sinuses are unremarkable.

## 2017-05-07 DIAGNOSIS — E8881 Metabolic syndrome: Secondary | ICD-10-CM | POA: Diagnosis not present

## 2017-05-07 DIAGNOSIS — K210 Gastro-esophageal reflux disease with esophagitis: Secondary | ICD-10-CM | POA: Diagnosis not present

## 2017-05-07 DIAGNOSIS — E782 Mixed hyperlipidemia: Secondary | ICD-10-CM | POA: Diagnosis not present

## 2017-05-07 DIAGNOSIS — I10 Essential (primary) hypertension: Secondary | ICD-10-CM | POA: Diagnosis not present

## 2018-05-28 DIAGNOSIS — E782 Mixed hyperlipidemia: Secondary | ICD-10-CM | POA: Diagnosis not present

## 2018-05-28 DIAGNOSIS — K210 Gastro-esophageal reflux disease with esophagitis: Secondary | ICD-10-CM | POA: Diagnosis not present

## 2018-05-28 DIAGNOSIS — E8881 Metabolic syndrome: Secondary | ICD-10-CM | POA: Diagnosis not present

## 2018-05-28 DIAGNOSIS — I10 Essential (primary) hypertension: Secondary | ICD-10-CM | POA: Diagnosis not present

## 2018-05-30 DIAGNOSIS — Z23 Encounter for immunization: Secondary | ICD-10-CM | POA: Diagnosis not present

## 2019-08-30 DIAGNOSIS — H00014 Hordeolum externum left upper eyelid: Secondary | ICD-10-CM | POA: Diagnosis not present

## 2019-09-02 DIAGNOSIS — H00024 Hordeolum internum left upper eyelid: Secondary | ICD-10-CM | POA: Diagnosis not present

## 2019-09-04 DIAGNOSIS — H00024 Hordeolum internum left upper eyelid: Secondary | ICD-10-CM | POA: Diagnosis not present

## 2019-10-31 DIAGNOSIS — E782 Mixed hyperlipidemia: Secondary | ICD-10-CM | POA: Diagnosis not present

## 2019-10-31 DIAGNOSIS — K2100 Gastro-esophageal reflux disease with esophagitis, without bleeding: Secondary | ICD-10-CM | POA: Diagnosis not present

## 2019-10-31 DIAGNOSIS — E8881 Metabolic syndrome: Secondary | ICD-10-CM | POA: Diagnosis not present

## 2019-10-31 DIAGNOSIS — I10 Essential (primary) hypertension: Secondary | ICD-10-CM | POA: Diagnosis not present

## 2019-11-07 DIAGNOSIS — R739 Hyperglycemia, unspecified: Secondary | ICD-10-CM | POA: Diagnosis not present

## 2019-11-07 DIAGNOSIS — E782 Mixed hyperlipidemia: Secondary | ICD-10-CM | POA: Diagnosis not present

## 2019-11-07 DIAGNOSIS — Z125 Encounter for screening for malignant neoplasm of prostate: Secondary | ICD-10-CM | POA: Diagnosis not present
